# Patient Record
Sex: Female | Born: 1980 | Race: White | Hispanic: No | Marital: Married | Smoking: Current every day smoker
Health system: Southern US, Community
[De-identification: ages and names within clinical notes are randomized; demographics above are authoritative.]

## PROBLEM LIST (undated history)

## (undated) DIAGNOSIS — G43909 Migraine, unspecified, not intractable, without status migrainosus: Secondary | ICD-10-CM

## (undated) HISTORY — PX: ECTOPIC PREGNANCY SURGERY: SHX613

## (undated) HISTORY — PX: ABDOMINAL HYSTERECTOMY: SHX81

## (undated) HISTORY — PX: APPENDECTOMY: SHX54

---

## 2013-06-01 ENCOUNTER — Emergency Department (HOSPITAL_BASED_OUTPATIENT_CLINIC_OR_DEPARTMENT_OTHER)
Admission: EM | Admit: 2013-06-01 | Discharge: 2013-06-01 | Disposition: A | Discharge status: Home / Self Care | Payer: BC Managed Care – PPO | Attending: Emergency Medicine | Admitting: Emergency Medicine

## 2013-06-01 ENCOUNTER — Encounter (HOSPITAL_BASED_OUTPATIENT_CLINIC_OR_DEPARTMENT_OTHER): Payer: Self-pay | Admitting: Emergency Medicine

## 2013-06-01 DIAGNOSIS — Z880 Allergy status to penicillin: Secondary | ICD-10-CM | POA: Insufficient documentation

## 2013-06-01 DIAGNOSIS — F17200 Nicotine dependence, unspecified, uncomplicated: Secondary | ICD-10-CM | POA: Insufficient documentation

## 2013-06-01 DIAGNOSIS — G43909 Migraine, unspecified, not intractable, without status migrainosus: Secondary | ICD-10-CM | POA: Insufficient documentation

## 2013-06-01 HISTORY — DX: Migraine, unspecified, not intractable, without status migrainosus: G43.909

## 2013-06-01 MED ORDER — KETOROLAC TROMETHAMINE 30 MG/ML IJ SOLN
30.0000 mg | Freq: Once | INTRAMUSCULAR | Status: AC
  Administered 2013-06-01: 30 mg via INTRAVENOUS
  Filled 2013-06-01: qty 1

## 2013-06-01 MED ORDER — DIPHENHYDRAMINE HCL 50 MG/ML IJ SOLN
12.5000 mg | Freq: Once | INTRAMUSCULAR | Status: AC
  Administered 2013-06-01: 12.5 mg via INTRAVENOUS
  Filled 2013-06-01: qty 1

## 2013-06-01 MED ORDER — METOCLOPRAMIDE HCL 5 MG/ML IJ SOLN
10.0000 mg | Freq: Once | INTRAMUSCULAR | Status: AC
  Administered 2013-06-01: 10 mg via INTRAVENOUS
  Filled 2013-06-01: qty 2

## 2013-06-01 NOTE — ED Provider Notes (Signed)
Medical screening examination/treatment/procedure(s) were performed by non-physician practitioner and as supervising physician I was immediately available for consultation/collaboration.   EKG Interpretation None        Arvle Grabe, MD 06/01/13 2350 

## 2013-06-01 NOTE — Discharge Instructions (Signed)
Migraine Headache A migraine headache is an intense, throbbing pain on one or both sides of your head. A migraine can last for 30 minutes to several hours. CAUSES  The exact cause of a migraine headache is not always known. However, a migraine may be caused when nerves in the brain become irritated and release chemicals that cause inflammation. This causes pain. Certain things may also trigger migraines, such as:  Alcohol.  Smoking.  Stress.  Menstruation.  Aged cheeses.  Foods or drinks that contain nitrates, glutamate, aspartame, or tyramine.  Lack of sleep.  Chocolate.  Caffeine.  Hunger.  Physical exertion.  Fatigue.  Medicines used to treat chest pain (nitroglycerine), birth control pills, estrogen, and some blood pressure medicines. SIGNS AND SYMPTOMS  Pain on one or both sides of your head.  Pulsating or throbbing pain.  Severe pain that prevents daily activities.  Pain that is aggravated by any physical activity.  Nausea, vomiting, or both.  Dizziness.  Pain with exposure to bright lights, loud noises, or activity.  General sensitivity to bright lights, loud noises, or smells. Before you get a migraine, you may get warning signs that a migraine is coming (aura). An aura may include:  Seeing flashing lights.  Seeing bright spots, halos, or zig-zag lines.  Having tunnel vision or blurred vision.  Having feelings of numbness or tingling.  Having trouble talking.  Having muscle weakness. DIAGNOSIS  A migraine headache is often diagnosed based on:  Symptoms.  Physical exam.  A CT scan or MRI of your head. These imaging tests cannot diagnose migraines, but they can help rule out other causes of headaches. TREATMENT Medicines may be given for pain and nausea. Medicines can also be given to help prevent recurrent migraines.  HOME CARE INSTRUCTIONS  Only take over-the-counter or prescription medicines for pain or discomfort as directed by your  health care provider. The use of long-term narcotics is not recommended.  Lie down in a dark, quiet room when you have a migraine.  Keep a journal to find out what may trigger your migraine headaches. For example, write down:  What you eat and drink.  How much sleep you get.  Any change to your diet or medicines.  Limit alcohol consumption.  Quit smoking if you smoke.  Get 7 9 hours of sleep, or as recommended by your health care provider.  Limit stress.  Keep lights dim if bright lights bother you and make your migraines worse. SEEK IMMEDIATE MEDICAL CARE IF:   Your migraine becomes severe.  You have a fever.  You have a stiff neck.  You have vision loss.  You have muscular weakness or loss of muscle control.  You start losing your balance or have trouble walking.  You feel faint or pass out.  You have severe symptoms that are different from your first symptoms. MAKE SURE YOU:   Understand these instructions.  Will watch your condition.  Will get help right away if you are not doing well or get worse. Document Released: 12/27/2004 Document Revised: 10/17/2012 Document Reviewed: 09/03/2012 [employer] Patient Information 2014 [employer].  

## 2013-06-01 NOTE — ED Provider Notes (Signed)
CSN: [id number]     Arrival date & time 06/01/13  1930 History   First MD Initiated Contact with Patient 06/01/13 2133     Chief Complaint  Patient presents with  . Migraine     (Consider location/radiation/quality/duration/timing/severity/associated sxs/prior Treatment) Patient is a 33 y.o. female presenting with migraines. The history is provided by the patient. No language interpreter was used.  Migraine This is a new problem. The current episode started today. The problem occurs constantly. Associated symptoms include headaches and nausea. Pertinent negatives include no chills, fever or vomiting. Associated symptoms comments: Symptoms of typical migraine headache which she gets infrequently. Located behind her right eye extending caudally. There has been nausea without vomiting. No fever. Positive photophobia..    Past Medical History  Diagnosis Date  . Migraine    Past Surgical History  Procedure Laterality Date  . Abdominal hysterectomy    . Appendectomy    . Ectopic pregnancy surgery     No family history on file. History  Substance Use Topics  . Smoking status: Current Every Day Smoker  . Smokeless tobacco: Not on file  . Alcohol Use: No   OB History   Grav Para Term Preterm Abortions TAB SAB Ect Mult Living                 Review of Systems  Constitutional: Negative for fever and chills.  Eyes: Positive for photophobia.  Respiratory: Negative.   Cardiovascular: Negative.   Gastrointestinal: Positive for nausea. Negative for vomiting.  Musculoskeletal: Negative.   Skin: Negative.   Neurological: Positive for headaches.      Allergies  Amoxicillin and Penicillins  Home Medications   Prior to Admission medications   Not on File   BP 122/77  Pulse 92  Temp(Src) 98.9 F (37.2 C) (Oral)  Resp 16  Ht 5\' 4"  (1.626 m)  Wt 125 lb (56.7 kg)  BMI 21.45 kg/m2  SpO2 98% Physical Exam  Constitutional: She is oriented to person, place, and time. She  appears well-developed and well-nourished.  HENT:  Head: Normocephalic.  Eyes: Pupils are equal, round, and reactive to light.  Neck: Normal range of motion. Neck supple.  Cardiovascular: Normal rate and regular rhythm.   Pulmonary/Chest: Effort normal and breath sounds normal.  Abdominal: Soft. Bowel sounds are normal. There is no tenderness. There is no rebound and no guarding.  Musculoskeletal: Normal range of motion.  Neurological: She is alert and oriented to person, place, and time. She has normal strength and normal reflexes. No sensory deficit. She displays a negative Romberg sign. Coordination normal.  CNs 3-12 grossly intact. No deficits of coordination.  Skin: Skin is warm and dry. No rash noted.  Psychiatric: She has a normal mood and affect.    ED Course  Procedures (including critical care time) Labs Review Labs Reviewed - No data to display  Imaging Review No results found.   EKG Interpretation None      MDM   Final diagnoses:  None    1. Migraine headache  Pain is resolved with headache cocktail. Symptoms typical of her migraine history. Stable for discharge.   Arnoldo Hooker, PA-C 06/01/13 2301

## 2013-06-01 NOTE — ED Notes (Signed)
Went to bed last night with a headache, woke up with it still.  C/o light sensitivity, nausea, no vomiting.  Taking sinus medication without relief.

## 2016-11-29 ENCOUNTER — Emergency Department (HOSPITAL_BASED_OUTPATIENT_CLINIC_OR_DEPARTMENT_OTHER)
Admission: EM | Admit: 2016-11-29 | Discharge: 2016-11-29 | Disposition: A | Discharge status: Home / Self Care | Payer: BC Managed Care – PPO | Attending: Emergency Medicine | Admitting: Emergency Medicine

## 2016-11-29 ENCOUNTER — Other Ambulatory Visit: Payer: Self-pay

## 2016-11-29 ENCOUNTER — Encounter (HOSPITAL_BASED_OUTPATIENT_CLINIC_OR_DEPARTMENT_OTHER): Payer: Self-pay | Admitting: *Deleted

## 2016-11-29 DIAGNOSIS — G43109 Migraine with aura, not intractable, without status migrainosus: Secondary | ICD-10-CM | POA: Insufficient documentation

## 2016-11-29 DIAGNOSIS — F17200 Nicotine dependence, unspecified, uncomplicated: Secondary | ICD-10-CM | POA: Diagnosis not present

## 2016-11-29 DIAGNOSIS — H538 Other visual disturbances: Secondary | ICD-10-CM | POA: Diagnosis present

## 2016-11-29 MED ORDER — METOCLOPRAMIDE HCL 10 MG PO TABS: 10.0000 mg | ORAL_TABLET | Freq: Three times a day (TID) | ORAL | 0 refills | Status: AC | PRN

## 2016-11-29 MED ORDER — METOCLOPRAMIDE HCL 5 MG/ML IJ SOLN
10.0000 mg | Freq: Once | INTRAMUSCULAR | Status: AC
  Administered 2016-11-29: 10 mg via INTRAVENOUS
  Filled 2016-11-29: qty 2

## 2016-11-29 MED ORDER — METHYLPREDNISOLONE SODIUM SUCC 125 MG IJ SOLR
125.0000 mg | Freq: Once | INTRAMUSCULAR | Status: AC
  Administered 2016-11-29: 125 mg via INTRAVENOUS
  Filled 2016-11-29: qty 2

## 2016-11-29 MED ORDER — IBUPROFEN 600 MG PO TABS: 600.0000 mg | ORAL_TABLET | Freq: Four times a day (QID) | ORAL | 0 refills | Status: AC | PRN

## 2016-11-29 MED ORDER — KETOROLAC TROMETHAMINE 30 MG/ML IJ SOLN
30.0000 mg | Freq: Once | INTRAMUSCULAR | Status: AC
  Administered 2016-11-29: 30 mg via INTRAVENOUS
  Filled 2016-11-29: qty 1

## 2016-11-29 MED ORDER — SODIUM CHLORIDE 0.9 % IV BOLUS (SEPSIS)
1000.0000 mL | Freq: Once | INTRAVENOUS | Status: AC
  Administered 2016-11-29: 1000 mL via INTRAVENOUS

## 2016-11-29 MED ORDER — DIPHENHYDRAMINE HCL 50 MG/ML IJ SOLN: 25.0000 mg | Freq: Once | INTRAMUSCULAR | Status: DC

## 2016-11-29 NOTE — ED Triage Notes (Signed)
Pt presents with vision changes (black floaters and white brightness in L eye): states brightness has resolved but floaters are still there. Reports it began around 0800. Denies fever, n/v/d. Reports headache.

## 2016-11-29 NOTE — ED Provider Notes (Signed)
[facility] EMERGENCY DEPARTMENT Provider Note   CSN: [id number] Arrival date & time: 11/29/16  [id number]     History   Chief Complaint Chief Complaint  Patient presents with  . Decreased Visual Acuity    HPI Makayla ClinesJulie A Wingard is a 36 y.o. female.  HPI Patient states she is been under increased stress lately.  She had a gradual onset posterior headache this morning around 8:00.  States she had some changes in her vision with floaters and seeing bright lights.  Also endorsed photophobia.  Has history of previous migraines.  No focal weakness or numbness. Past Medical History:  Diagnosis Date  . Migraine     There are no active problems to display for this patient.   Past Surgical History:  Procedure Laterality Date  . ABDOMINAL HYSTERECTOMY    . APPENDECTOMY    . ECTOPIC PREGNANCY SURGERY      OB History    No data available       Home Medications    Prior to Admission medications   Medication Sig Start Date End Date Taking? Authorizing Provider  ibuprofen (ADVIL,MOTRIN) 600 MG tablet Take 1 tablet (600 mg total) by mouth every 6 (six) hours as needed. 11/29/16   Loren RacerYelverton, Eliazar Olivar, MD  metoCLOPramide (REGLAN) 10 MG tablet Take 1 tablet (10 mg total) by mouth every 8 (eight) hours as needed for nausea. 11/29/16   Loren RacerYelverton, Brondon Wann, MD    Family History No family history on file.  Social History Social History   Tobacco Use  . Smoking status: Current Every Day Smoker  . Smokeless tobacco: Never Used  Substance Use Topics  . Alcohol use: No  . Drug use: No     Allergies   Amoxicillin and Penicillins   Review of Systems Review of Systems  Constitutional: Negative for chills and fever.  HENT: Negative for sinus pressure, sinus pain and trouble swallowing.   Eyes: Positive for visual disturbance. Negative for pain.  Respiratory: Negative for shortness of breath.   Cardiovascular: Negative for chest pain.  Gastrointestinal: Negative for abdominal  pain, nausea and vomiting.  Musculoskeletal: Negative for back pain, myalgias and neck pain.  Skin: Negative for rash and wound.  Neurological: Positive for headaches. Negative for dizziness, weakness, light-headedness and numbness.  All other systems reviewed and are negative.    Physical Exam Updated Vital Signs BP 99/76   Pulse 91   Temp 98.5 F (36.9 C) (Oral)   Resp 18   SpO2 99%   Physical Exam  Constitutional: She is oriented to person, place, and time. She appears well-developed and well-nourished.  HENT:  Head: Normocephalic and atraumatic.  Mouth/Throat: Oropharynx is clear and moist. No oropharyngeal exudate.  Eyes: EOM are normal. Pupils are equal, round, and reactive to light.  Bilateral retinal exam without evidence of papilledema or hemorrhage.  Neck: Normal range of motion. Neck supple.  No posterior midline cervical tenderness to palpation.  Cardiovascular: Normal rate and regular rhythm.  Pulmonary/Chest: Effort normal and breath sounds normal.  Abdominal: Soft. Bowel sounds are normal. There is no tenderness. There is no rebound and no guarding.  Musculoskeletal: Normal range of motion. She exhibits no edema or tenderness.  Lymphadenopathy:    She has no cervical adenopathy.  Neurological: She is alert and oriented to person, place, and time.  Patient is alert and oriented x3 with clear, goal oriented speech. Patient has 5/5 motor in all extremities. Sensation is intact to light touch. Bilateral finger-to-nose is normal  with no signs of dysmetria. Patient has a normal gait and walks without assistance.  Skin: Skin is warm and dry. No rash noted. No erythema.  Psychiatric: She has a normal mood and affect. Her behavior is normal.  Nursing note and vitals reviewed.    ED Treatments / Results  Labs (all labs ordered are listed, but only abnormal results are displayed) Labs Reviewed - No data to display  EKG  EKG Interpretation None        Radiology No results found.  Procedures Procedures (including critical care time)  Medications Ordered in ED Medications  ketorolac (TORADOL) 30 MG/ML injection 30 mg (30 mg Intravenous Given 11/29/16 0938)  metoCLOPramide (REGLAN) injection 10 mg (10 mg Intravenous Given 11/29/16 0945)  methylPREDNISolone sodium succinate (SOLU-MEDROL) 125 mg/2 mL injection 125 mg (125 mg Intravenous Given 11/29/16 0941)  sodium chloride 0.9 % bolus 1,000 mL (0 mLs Intravenous Stopped 11/29/16 1019)     Initial Impression / Assessment and Plan / ED Course  I have reviewed the triage vital signs and the nursing notes.  Pertinent labs & imaging results that were available during my care of the patient were reviewed by me and considered in my medical decision making (see chart for details).     Suspect likely migraine with aura.  Normal neurologic exam.  We will treat symptomatically and reevaluate.   Patient's headache has resolved.  Visual changes have resolved.  Continues to have a normal neurologic exam. Final Clinical Impressions(s) / ED Diagnoses   Final diagnoses:  Migraine with aura and without status migrainosus, not intractable    ED Discharge Orders        Ordered    metoCLOPramide (REGLAN) 10 MG tablet  Every 8 hours PRN     11/29/16 1135    ibuprofen (ADVIL,MOTRIN) 600 MG tablet  Every 6 hours PRN     11/29/16 1135       Loren RacerYelverton, Savino Whisenant, MD 11/29/16 1136

## 2016-11-29 NOTE — ED Notes (Signed)
ED Provider at bedside. 

## 2017-05-24 ENCOUNTER — Other Ambulatory Visit: Payer: Self-pay

## 2017-05-24 ENCOUNTER — Emergency Department (HOSPITAL_BASED_OUTPATIENT_CLINIC_OR_DEPARTMENT_OTHER)
Admission: EM | Admit: 2017-05-24 | Discharge: 2017-05-25 | Disposition: A | Discharge status: Home / Self Care | Payer: BC Managed Care – PPO | Attending: Emergency Medicine | Admitting: Emergency Medicine

## 2017-05-24 ENCOUNTER — Emergency Department (HOSPITAL_BASED_OUTPATIENT_CLINIC_OR_DEPARTMENT_OTHER): Payer: BC Managed Care – PPO

## 2017-05-24 ENCOUNTER — Encounter (HOSPITAL_BASED_OUTPATIENT_CLINIC_OR_DEPARTMENT_OTHER): Payer: Self-pay

## 2017-05-24 DIAGNOSIS — F17210 Nicotine dependence, cigarettes, uncomplicated: Secondary | ICD-10-CM | POA: Insufficient documentation

## 2017-05-24 DIAGNOSIS — R079 Chest pain, unspecified: Secondary | ICD-10-CM | POA: Diagnosis present

## 2017-05-24 DIAGNOSIS — R0789 Other chest pain: Secondary | ICD-10-CM | POA: Diagnosis not present

## 2017-05-24 LAB — BASIC METABOLIC PANEL
Anion gap: 9 (ref 5–15)
BUN: 14 mg/dL (ref 6–20)
CHLORIDE: 105 mmol/L (ref 101–111)
CO2: 25 mmol/L (ref 22–32)
CREATININE: 0.61 mg/dL (ref 0.44–1.00)
Calcium: 8.3 mg/dL — ABNORMAL LOW (ref 8.9–10.3)
GFR calc Af Amer: >60 mL/min (ref >60)
GFR calc non Af Amer: >60 mL/min (ref >60)
GLUCOSE: 115 mg/dL — AB (ref 65–99)
Potassium: 3.3 mmol/L — ABNORMAL LOW (ref 3.5–5.1)
Sodium: 139 mmol/L (ref 135–145)

## 2017-05-24 LAB — CBC
HCT: 40 % (ref 36.0–46.0)
Hemoglobin: 13.8 g/dL (ref 12.0–15.0)
MCH: 33 pg (ref 26.0–34.0)
MCHC: 34.5 g/dL (ref 30.0–36.0)
MCV: 95.7 fL (ref 78.0–100.0)
Platelets: 219 10*3/uL (ref 150–400)
RBC: 4.18 MIL/uL (ref 3.87–5.11)
RDW: 11.7 % (ref 11.5–15.5)
WBC: 7.2 10*3/uL (ref 4.0–10.5)

## 2017-05-24 LAB — TROPONIN I: Troponin I: <0.03 ng/mL (ref <0.03)

## 2017-05-24 NOTE — ED Triage Notes (Signed)
C/o CP since approx 8am-NAD-steady gait

## 2017-05-25 NOTE — Discharge Instructions (Addendum)
Ibuprofen 600 mg every 6 hours as needed for pain.  Follow-up with cardiology if symptoms are not improving in the next 1 to 2 days.  The contact information for the [facility] cardiology clinic has been provided in this discharge summary for you to call and make these arrangements.  Return to the emergency department in the meantime if your symptoms worsen.

## 2017-05-25 NOTE — ED Provider Notes (Signed)
[facility] EMERGENCY DEPARTMENT Provider Note   CSN: [id number] Arrival date & time: 05/24/17  2157     History   Chief Complaint Chief Complaint  Patient presents with  . Chest Pain    HPI Makayla Swanson is a 37 y.o. female.  Patient is a 37 year old female with no significant past medical history.  She presents today for evaluation of chest discomfort.  She reports that this started this morning after waking up.  This evening when she was at work, this discomfort became worse.  She describes this as a heaviness in her upper chest with no associated nausea, shortness of breath, diaphoresis, or radiation to the arm or jaw.  Her symptoms are worse with movement and change in position.  It is better when she lies still.  She denies any recent exertional symptoms.  She has no cardiac risk factors.  The history is provided by the patient.  Chest Pain   This is a new problem. Episode onset: This morning. The problem occurs constantly. The problem has been gradually worsening. The pain is associated with movement. The pain is present in the substernal region. The pain is mild. The quality of the pain is described as pressure-like. The pain does not radiate. Pertinent negatives include no cough, no fever, no numbness, no palpitations and no shortness of breath. She has tried nothing for the symptoms. The treatment provided no relief.    Past Medical History:  Diagnosis Date  . Migraine     There are no active problems to display for this patient.   Past Surgical History:  Procedure Laterality Date  . ABDOMINAL HYSTERECTOMY    . APPENDECTOMY    . ECTOPIC PREGNANCY SURGERY       OB History   None      Home Medications    Prior to Admission medications   Medication Sig Start Date End Date Taking? Authorizing Provider  ibuprofen (ADVIL,MOTRIN) 600 MG tablet Take 1 tablet (600 mg total) by mouth every 6 (six) hours as needed. 11/29/16   Loren Racer, MD    metoCLOPramide (REGLAN) 10 MG tablet Take 1 tablet (10 mg total) by mouth every 8 (eight) hours as needed for nausea. 11/29/16   Loren Racer, MD    Family History No family history on file.  Social History Social History   Tobacco Use  . Smoking status: Current Every Day Smoker    Types: Cigarettes  . Smokeless tobacco: Never Used  Substance Use Topics  . Alcohol use: No  . Drug use: No     Allergies   Amoxicillin; Penicillins; and Tape   Review of Systems Review of Systems  Constitutional: Negative for fever.  Respiratory: Negative for cough and shortness of breath.   Cardiovascular: Positive for chest pain. Negative for palpitations.  Neurological: Negative for numbness.  All other systems reviewed and are negative.    Physical Exam Updated Vital Signs BP (!) 136/97 (BP Location: Right Arm)   Pulse 84   Temp 98.5 F (36.9 C) (Oral)   Resp 20   Ht  (1.626 m)   Wt 62.7 kg (138 lb 3.7 oz)   SpO2 99%   BMI 23.73 kg/m   Physical Exam  Constitutional: She is oriented to person, place, and time. She appears well-developed and well-nourished. No distress.  HENT:  Head: Normocephalic and atraumatic.  Neck: Normal range of motion. Neck supple.  Cardiovascular: Normal rate and regular rhythm. Exam reveals no gallop and no  friction rub.  No murmur heard. There is tenderness to palpation over the left upper anterior chest wall which reproduces her symptoms.  Pulmonary/Chest: Effort normal and breath sounds normal. No respiratory distress. She has no wheezes.  Abdominal: Soft. Bowel sounds are normal. She exhibits no distension. There is no tenderness.  Musculoskeletal: Normal range of motion.  Neurological: She is alert and oriented to person, place, and time.  Skin: Skin is warm and dry. She is not diaphoretic.  Nursing note and vitals reviewed.    ED Treatments / Results  Labs (all labs ordered are listed, but only abnormal results are  displayed) Labs Reviewed  BASIC METABOLIC PANEL - Abnormal; Notable for the following components:      Result Value   Potassium 3.3 (*)    Glucose, Bld 115 (*)    Calcium 8.3 (*)    All other components within normal limits  CBC  TROPONIN I    EKG EKG Interpretation  Date/Time:  Wednesday May 24 2017 22:03:59 EDT Ventricular Rate:  93 PR Interval:  140 QRS Duration: 82 QT Interval:  372 QTC Calculation: 462 R Axis:   59 Text Interpretation:  Normal sinus rhythm Nonspecific ST and T wave abnormality Prolonged QT Abnormal ECG No prior ecg for comparison Confirmed by Geoffery Lyons ([id number]) on 05/25/2017 12:02:09 AM   Radiology Dg Chest 2 View  Result Date: 05/24/2017 CLINICAL DATA:  Acute onset of generalized chest pain. EXAM: CHEST - 2 VIEW COMPARISON:  None. FINDINGS: The lungs are well-aerated and clear. There is no evidence of focal opacification, pleural effusion or pneumothorax. The heart is normal in size; the mediastinal contour is within normal limits. No acute osseous abnormalities are seen. IMPRESSION: No acute cardiopulmonary process seen. Electronically Signed   By: Roanna Raider M.D.   On: 05/24/2017 23:09    Procedures Procedures (including critical care time)  Medications Ordered in ED Medications - No data to display   Initial Impression / Assessment and Plan / ED Course  I have reviewed the triage vital signs and the nursing notes.  Pertinent labs & imaging results that were available during my care of the patient were reviewed by me and considered in my medical decision making (see chart for details).  Patient presents with atypical chest discomfort that is reproducible and worse when she changes position.  It has been going on greater than 12 hours.  She has a negative troponin and EKG that shows subtle T wave abnormalities.  There are no priors for comparison, however I suspect that these are old and baseline for her.  I feel as though she is appropriate  for discharge with outpatient follow-up as needed.  She will be advised to take ibuprofen for what I believed to be musculoskeletal chest pain.  She is to follow-up with cardiology if her symptoms persist and return to the ER if symptoms worsen.  Final Clinical Impressions(s) / ED Diagnoses   Final diagnoses:  None    ED Discharge Orders    None       Geoffery Lyons, MD 05/25/17 [phone]

## 2019-04-14 IMAGING — CR DG CHEST 2V
2 series · 2 of 2 positions shown · non-contrast
Comparison: None.

CLINICAL DATA: Acute onset of generalized chest pain.

EXAM:
CHEST - 2 VIEW

[w chest pa]
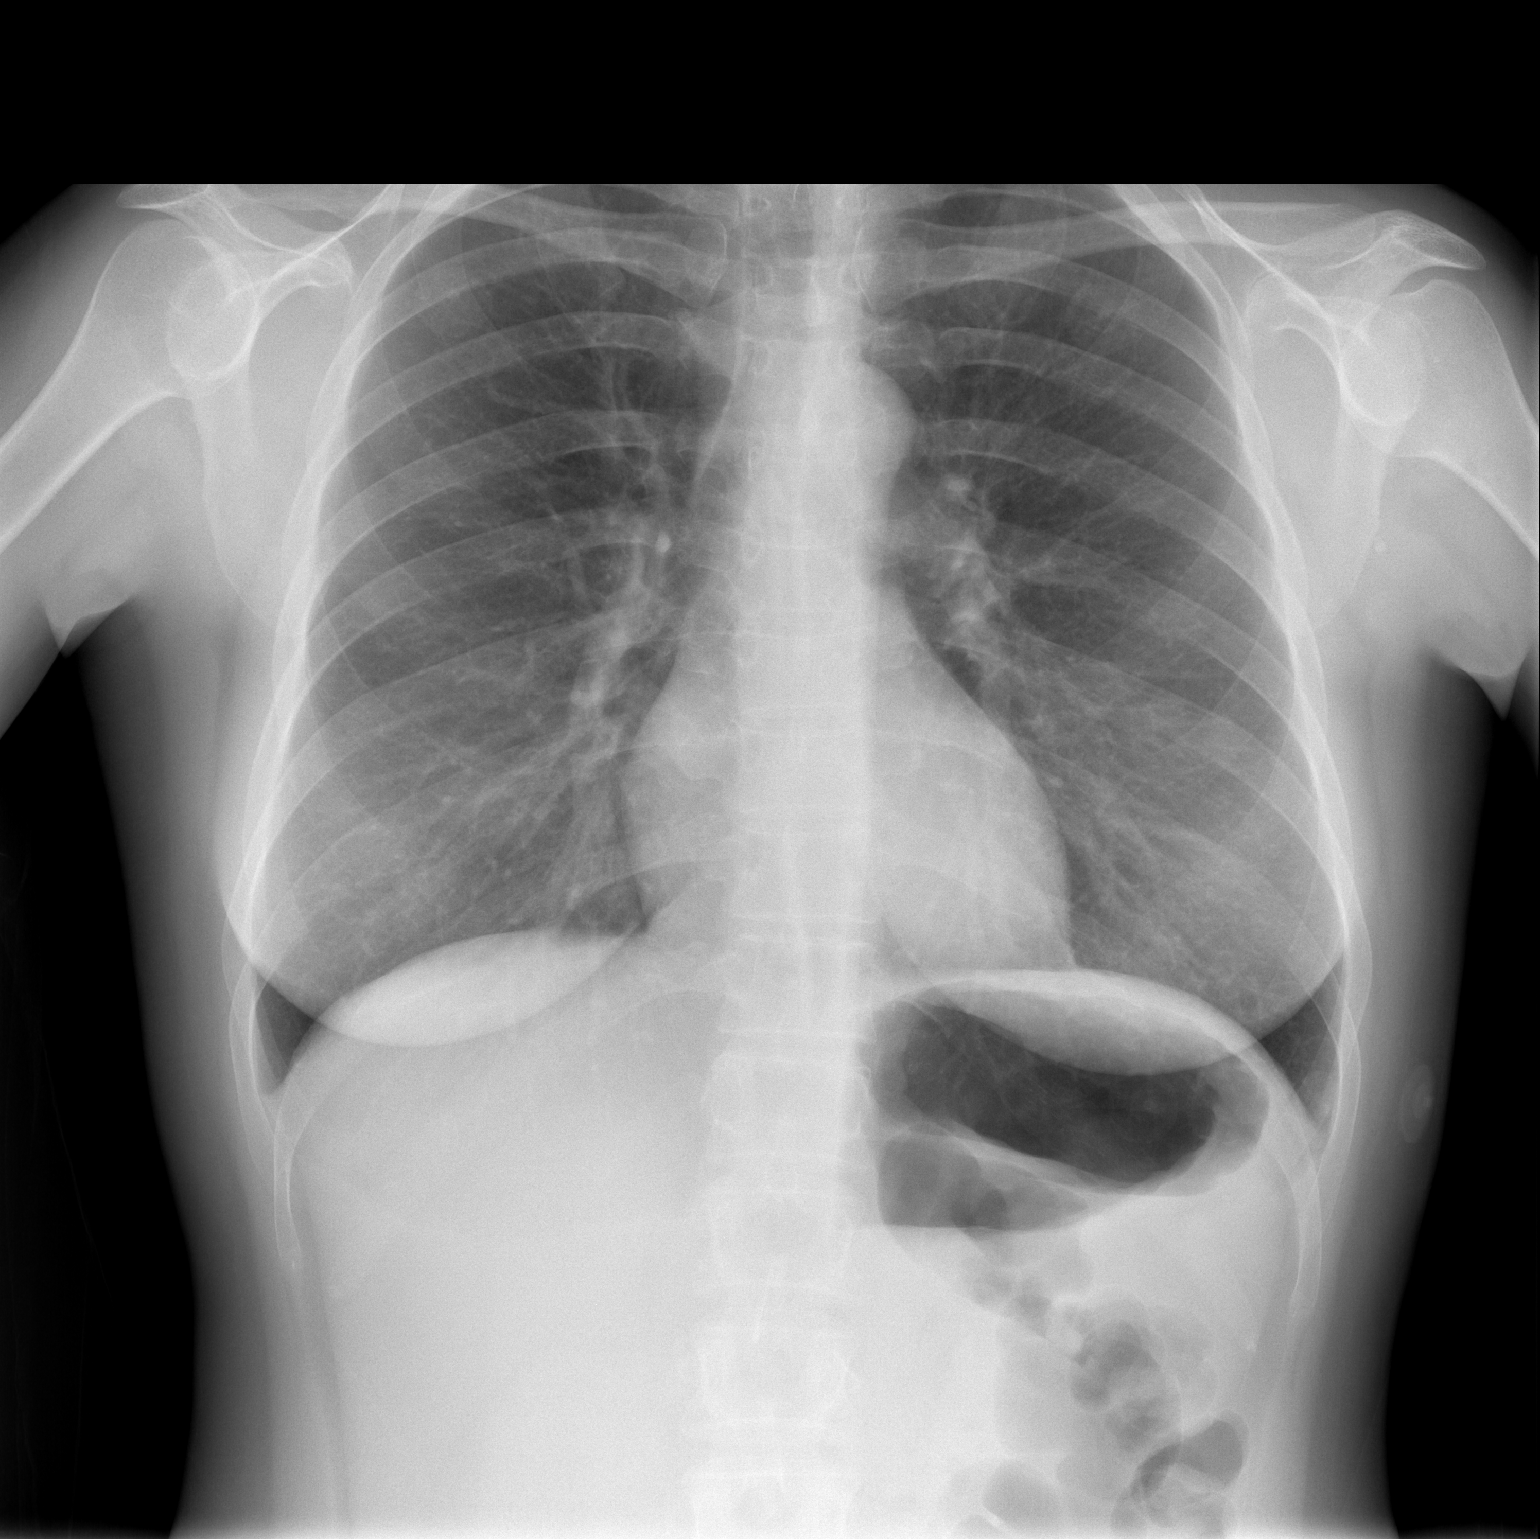

[w chest lat]
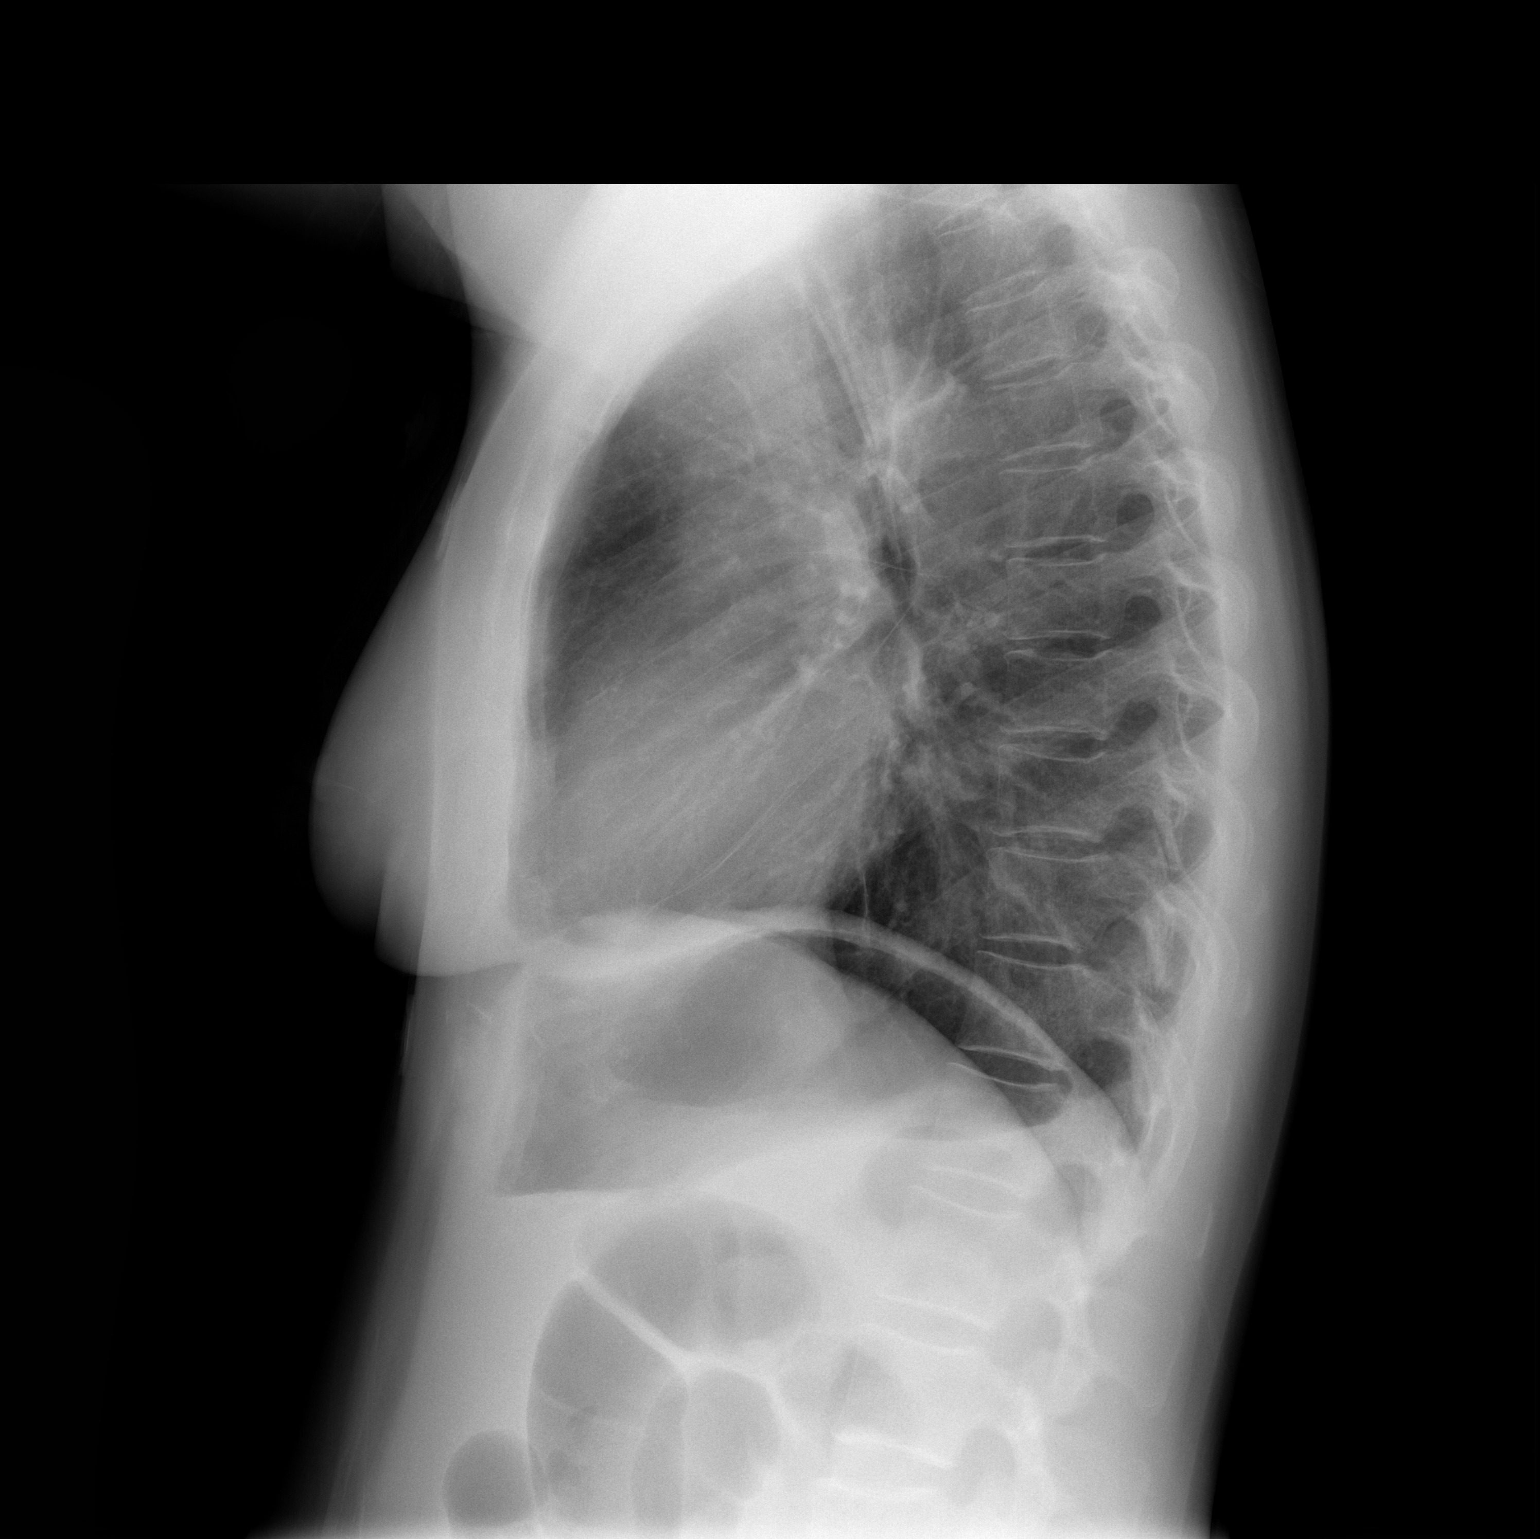

[2 of 2 positions shown; findings below may reference images not displayed]

FINDINGS: The lungs are well-aerated and clear. There is no evidence of focal
opacification, pleural effusion or pneumothorax.

The heart is normal in size; the mediastinal contour is within
normal limits. No acute osseous abnormalities are seen.
IMPRESSION: No acute cardiopulmonary process seen.
